# Patient Record
Sex: Male | Born: 1995 | Race: White | Hispanic: No | Marital: Single | Smoking: Never smoker
Health system: Southern US, Community
[De-identification: ages and names within clinical notes are randomized; demographics above are authoritative.]

## PROBLEM LIST (undated history)

## (undated) DIAGNOSIS — F5232 Male orgasmic disorder: Secondary | ICD-10-CM

## (undated) HISTORY — DX: Male orgasmic disorder: F52.32

## (undated) HISTORY — PX: APPENDECTOMY: SHX54

---
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
## 2018-06-10 DIAGNOSIS — S9031XA Contusion of right foot, initial encounter: Secondary | ICD-10-CM | POA: Diagnosis not present

## 2018-12-02 DIAGNOSIS — S0990XA Unspecified injury of head, initial encounter: Secondary | ICD-10-CM | POA: Diagnosis not present

## 2018-12-02 DIAGNOSIS — Z880 Allergy status to penicillin: Secondary | ICD-10-CM | POA: Diagnosis not present

## 2018-12-02 DIAGNOSIS — W500XXA Accidental hit or strike by another person, initial encounter: Secondary | ICD-10-CM | POA: Diagnosis not present

## 2018-12-02 DIAGNOSIS — S01111A Laceration without foreign body of right eyelid and periocular area, initial encounter: Secondary | ICD-10-CM | POA: Diagnosis not present

## 2018-12-02 DIAGNOSIS — S098XXA Other specified injuries of head, initial encounter: Secondary | ICD-10-CM | POA: Diagnosis not present

## 2019-02-06 DIAGNOSIS — Z20828 Contact with and (suspected) exposure to other viral communicable diseases: Secondary | ICD-10-CM | POA: Diagnosis not present

## 2019-04-04 DIAGNOSIS — Z20828 Contact with and (suspected) exposure to other viral communicable diseases: Secondary | ICD-10-CM | POA: Diagnosis not present

## 2019-04-25 DIAGNOSIS — Z20828 Contact with and (suspected) exposure to other viral communicable diseases: Secondary | ICD-10-CM | POA: Diagnosis not present

## 2019-05-17 DIAGNOSIS — Z20828 Contact with and (suspected) exposure to other viral communicable diseases: Secondary | ICD-10-CM | POA: Diagnosis not present

## 2019-07-10 DIAGNOSIS — Z20828 Contact with and (suspected) exposure to other viral communicable diseases: Secondary | ICD-10-CM | POA: Diagnosis not present
